# Patient Record
Sex: Female | Born: 1960 | Race: White | Hispanic: No | Marital: Married | Smoking: Never smoker
Health system: Southern US, Community
[De-identification: ages and names within clinical notes are randomized; demographics above are authoritative.]

## PROBLEM LIST (undated history)

## (undated) DIAGNOSIS — F32A Depression, unspecified: Secondary | ICD-10-CM

## (undated) HISTORY — PX: HYSTERECTOMY: SHX81

---

## 2016-12-22 ENCOUNTER — Other Ambulatory Visit: Payer: Self-pay

## 2016-12-22 DIAGNOSIS — M5412 Radiculopathy, cervical region: Secondary | ICD-10-CM

## 2016-12-25 ENCOUNTER — Ambulatory Visit
Admission: RE | Admit: 2016-12-25 | Discharge: 2016-12-25 | Disposition: A | Discharge status: Home / Self Care | Payer: Commercial Managed Care - PPO | Source: Ambulatory Visit

## 2016-12-25 ENCOUNTER — Ambulatory Visit: Payer: Commercial Managed Care - PPO

## 2016-12-25 DIAGNOSIS — M50123 Cervical disc disorder at C6-C7 level with radiculopathy: Secondary | ICD-10-CM | POA: Insufficient documentation

## 2016-12-25 DIAGNOSIS — M47892 Other spondylosis, cervical region: Secondary | ICD-10-CM | POA: Insufficient documentation

## 2016-12-25 DIAGNOSIS — M5412 Radiculopathy, cervical region: Secondary | ICD-10-CM

## 2017-08-30 ENCOUNTER — Other Ambulatory Visit: Payer: Self-pay | Admitting: "Endocrinology

## 2017-08-30 DIAGNOSIS — Z780 Asymptomatic menopausal state: Secondary | ICD-10-CM

## 2017-08-31 ENCOUNTER — Ambulatory Visit: Payer: Commercial Managed Care - PPO | Attending: "Endocrinology

## 2017-08-31 DIAGNOSIS — Z780 Asymptomatic menopausal state: Secondary | ICD-10-CM | POA: Insufficient documentation

## 2019-07-24 ENCOUNTER — Other Ambulatory Visit: Payer: Self-pay | Admitting: "Endocrinology

## 2019-07-24 DIAGNOSIS — M8580 Other specified disorders of bone density and structure, unspecified site: Secondary | ICD-10-CM

## 2019-08-24 ENCOUNTER — Ambulatory Visit: Payer: Commercial Managed Care - PPO

## 2019-09-01 ENCOUNTER — Ambulatory Visit
Admission: RE | Admit: 2019-09-01 | Discharge: 2019-09-01 | Disposition: A | Discharge status: Home / Self Care | Payer: Commercial Managed Care - PPO | Source: Ambulatory Visit | Attending: "Endocrinology | Admitting: "Endocrinology

## 2019-09-01 DIAGNOSIS — M8580 Other specified disorders of bone density and structure, unspecified site: Secondary | ICD-10-CM | POA: Insufficient documentation

## 2019-10-12 ENCOUNTER — Other Ambulatory Visit: Payer: Self-pay | Admitting: "Endocrinology

## 2019-10-21 ENCOUNTER — Other Ambulatory Visit: Payer: Self-pay

## 2019-10-21 DIAGNOSIS — K862 Cyst of pancreas: Secondary | ICD-10-CM

## 2019-10-23 ENCOUNTER — Ambulatory Visit: Payer: Commercial Managed Care - PPO

## 2019-10-27 ENCOUNTER — Other Ambulatory Visit: Payer: Self-pay

## 2020-11-22 ENCOUNTER — Other Ambulatory Visit: Payer: Self-pay | Admitting: "Endocrinology

## 2020-12-11 ENCOUNTER — Emergency Department
Admission: EM | Admit: 2020-12-11 | Discharge: 2020-12-11 | Disposition: A | Discharge status: Home / Self Care | Payer: Commercial Managed Care - PPO | Attending: Emergency Medicine | Admitting: Emergency Medicine

## 2020-12-11 DIAGNOSIS — S61012A Laceration without foreign body of left thumb without damage to nail, initial encounter: Secondary | ICD-10-CM | POA: Insufficient documentation

## 2020-12-11 DIAGNOSIS — Z23 Encounter for immunization: Secondary | ICD-10-CM | POA: Insufficient documentation

## 2020-12-11 DIAGNOSIS — W260XXA Contact with knife, initial encounter: Secondary | ICD-10-CM | POA: Insufficient documentation

## 2020-12-11 HISTORY — DX: Depression, unspecified: F32.A

## 2020-12-11 MED ORDER — TETANUS-DIPHTH-ACELL PERTUSSIS 5-2.5-18.5 LF-MCG/0.5 IM SUSP
0.5000 mL | Freq: Once | INTRAMUSCULAR | Status: AC
Start: 2020-12-11 — End: 2020-12-11
  Administered 2020-12-11: 09:00:00 0.5 mL via INTRAMUSCULAR
  Filled 2020-12-11: qty 0.5

## 2020-12-11 MED ORDER — LIDOCAINE HCL 1 % IJ SOLN
20.0000 mL | Freq: Once | INTRAMUSCULAR | Status: DC
Start: 2020-12-11 — End: 2020-12-11
  Filled 2020-12-11: qty 20

## 2020-12-11 NOTE — ED Notes (Signed)
Bed: EX28  Expected date:   Expected time:   Means of arrival:   Comments:

## 2020-12-11 NOTE — ED Triage Notes (Signed)
Krista Barnett is a 60 y.o. female who presents to ED with laceration to left thumb sustained on a new kitchen knife this morning. Tetanus status unknown.     BP 130/87    Pulse 72    Temp 97.9 F (36.6 C) (Oral)    Resp 16    Ht 5\' 2"  (1.575 m)    Wt 65.8 kg    SpO2 98%    BMI 26.52 kg/m

## 2020-12-11 NOTE — ED Provider Notes (Signed)
EMERGENCY DEPARTMENT HISTORY AND PHYSICAL EXAM     None        Date: 12/11/2020  Patient Name: Krista Barnett  Attending Physician: Donny Pique, MD  Advanced Practice Provider: Rexene Alberts, PA-C    History of Presenting Illness       History Provided By: Patient  Interpreter: None required     Chief Complaint:   Finger laceration      HPI: Krista Barnett is a 60 y.o. female with a PMH of DM and HLD presenting to the ED with a laceration of the left thumb that occurred JPTA. Patient reports mild aching sensation of the left thumb. No worsening factors. Took tylenol JPTA with mild relief. Mechanism of injury was patient states she was putting away a knife in a drawer when it slipped, accidentally cutting her thumb. Patient denies numbness and weakness. Patient's Tetanus status unknown.     There were no other injuries.    PCP: Karl Ito, MD  SPECIALISTS:    No current facility-administered medications for this encounter.    Current Outpatient Medications:     metFORMIN (GLUCOPHAGE) 500 MG tablet, , Disp: , Rfl:     atorvastatin (LIPITOR) 10 MG tablet, every 24 hours, Disp: , Rfl:     gabapentin (NEURONTIN) 100 MG capsule, gabapentin 100 mg capsule, Disp: , Rfl:     levothyroxine (SYNTHROID) 88 MCG tablet, 88 mcg daily, Disp: , Rfl:     Past History     Past Medical History:  Past Medical History:   Diagnosis Date    Depression     per-diabetes       Past Surgical History:  Past Surgical History:   Procedure Laterality Date    HYSTERECTOMY         Family History:  No family history on file.    Social History:  Social History     Tobacco Use    Smoking status: Never Smoker    Smokeless tobacco: Never Used   Substance Use Topics    Alcohol use: Yes    Drug use: Never       Allergies:  No Known Allergies    Review of Systems     Review of Systems   Skin:        Positive for laceration. Negative for color change.   Neurological: Negative for tingling, numbness.    Physical Exam     Vitals:    12/11/20  0757   BP: 130/87   Pulse: 72   Resp: 16   Temp: 97.9 F (36.6 C)   TempSrc: Oral   SpO2: 98%   Weight: 65.8 kg   Height: 5\' 2"  (1.575 m)     Pulse Oximetry Analysis - Normal SpO2: 98 % on RA    Physical Exam  Vitals and nursing note reviewed.   Constitutional:       General: She is not in acute distress.     Appearance: Normal appearance.   HENT:      Head: Normocephalic and atraumatic.      Nose: Nose normal.   Eyes:      General:         Right eye: No discharge.         Left eye: No discharge.      Conjunctiva/sclera: Conjunctivae normal.   Cardiovascular:      Rate and Rhythm: Normal rate.      Pulses: Normal pulses.  Comments: Left radial pulse 2+  Pulmonary:      Effort: Pulmonary effort is normal. No respiratory distress.   Musculoskeletal:      Cervical back: Normal range of motion and neck supple.      Comments: Full range of motion of left thumb.  Flexion and extension strength 5 out of 5.   Skin:     General: Skin is warm and dry.      Comments: 1 cm superficial laceration of fingertip pad of left thumb. No active bleeding. No FB. Tidy wound edges.   Neurological:      Mental Status: She is alert and oriented to person, place, and time. Mental status is at baseline.      Comments: Sensation intact distal left thumb.             Diagnostic Study Results     Labs -  Lab Results    None         Radiologic Studies -   Radiology Results (24 Hour)     ** No results found for the last 24 hours. **      .      Medical Decision Making     I reviewed the vital signs, available nursing notes, past medical history, past surgical history, family history and social history.    Prior Records: Nursing notes.     Procedures:   Lac Repair    Date/Time: 12/11/2020 5:10 PM  Performed by: Rexene Alberts, PA  Authorized by: Donny Pique, MD     Consent:     Consent obtained:  Verbal    Consent given by:  Patient    Risks discussed:  Infection, need for additional repair, pain and poor cosmetic result  Laceration  details:     Location:  Finger    Finger location:  L thumb    Length (cm):  1  Repair type:     Repair type:  Simple  Pre-procedure details:     Preparation:  Patient was prepped and draped in usual sterile fashion  Exploration:     Hemostasis achieved with:  Direct pressure    Wound exploration: entire depth of wound probed and visualized      Wound extent: no foreign bodies/material noted, no nerve damage noted, no tendon damage noted, no underlying fracture noted and no vascular damage noted      Contaminated: no    Treatment:     Area cleansed with:  Saline    Amount of cleaning:  Standard    Irrigation solution:  Sterile saline    Irrigation method:  Pressure wash    Visualized foreign bodies/material removed: no    Skin repair:     Repair method:  Tissue adhesive  Approximation:     Approximation:  Close  Post-procedure details:     Dressing:  Adhesive bandage    Patient tolerance of procedure:  Tolerated well, no immediate complications        ED Medications  Medications   tetanus-diphth-acell pertussis (BOOSTRIX) injection 0.5 mL (0.5 mLs Intramuscular Given 12/11/20 0919)       ED Course:       Provider Notes:   Laceration to the left thumb. NVI. Wound fully explored. X-Ray was not performed. Repaired with dermabond. Removal not required. Tetanus booster was indicated and administered in the ED. Antibiotics not indicated at this time.    Wound care instructions were given to patient including but not limited to:  return for redness, swelling, purulent discharge, continued or worsening numbness or tinging.  These instructions were understood and the patient promised to return for worsening condition.  Advised patient that she should not get the area wet or the Dermabond will dissolve more quickly.  Patient verbalized understanding.    Patient was discharged by me at 0907 AM     Departure Condition: Good, stable  Patient Reevaluation (after medication/intervention): Yes, improved  Mobility at Discharge:  Ambulatory  Patient Teaching: Discharge instructions reviewed and patient verbalizes understanding  Departure Mode: By self          Diagnosis     Clinical Impression:   1. Laceration of left thumb without foreign body without damage to nail, initial encounter    2. Need for tetanus booster        Treatment Plan:   ED Disposition     ED Disposition Condition Date/Time Comment    Discharge  Wed Dec 11, 2020  9:07 AM Krista Barnett discharge to home/self care.    Condition at disposition: Stable            _______________________________    CHART OWNERSHIP: I, Foye Damron A Shareen Capwell, PA-C, am the primary clinician of record.  _______________________________       Rexene Alberts, PA  12/11/20 1720       Donny Pique, MD  12/12/20 [phone]

## 2020-12-11 NOTE — ED Notes (Signed)
Bed: EX29  Expected date:   Expected time:   Means of arrival:   Comments:

## 2020-12-11 NOTE — Discharge Instructions (Signed)
Laceration, Tissue Glue     Your wound has been closed with tissue glue.     Tissue glue is a sterile, liquid skin glue that holds wound edges together. The film usually stays in place for 5 to 10 days. Afterwards, it naturally falls off of your skin.  · There are 2 common brands of tissue glue: DERMABOND® and INDERMIL®.     Some swelling, redness, and pain are common with all wounds. This normally goes away as the wound heals. If there is more swelling, redness, or pain or the wound feels warm to touch, talk to your doctor. If the wound edges open again, contact your doctor. If the wound edges separate, contact your doctor.     If a bandage is used:  · Keep the bandage dry.  · Change the dressing every day until the adhesive film falls off or if it gets wet unless the doctor tells you to do something different.  · Do not place tape right over the tissue glue because taking off the tape may also take off the film.     Do not let the wound be exposed very long to sunlight or tanning lamps while the film is in place.     Do not scratch, rub, or pick at the tissue glue. This may loosen the film before the wound heals.     DO NOT put liquid or ointment medicines or any other product to the wound while the tissue glue is in place. This may loosen the film before the wound heals.     Protect the wound from being re-injured until the skin has had enough time to heal.     Every so often you may wet the wound quickly in the shower or bath. Until the tissue glue falls off on its own, do not soak or scrub your wound, do not swim and avoid periods of heavy activity that cause sweating. After showering or bathing, gently blot your wound dry with a soft towel. If using a protective dressing, put on a fresh, dry bandage. Keep tape off the tissue glue.     Put a clean, dry bandage over the wound if necessary to protect the wound.     YOU SHOULD SEEK MEDICAL ATTENTION IMMEDIATELY, EITHER HERE OR AT THE NEAREST EMERGENCY DEPARTMENT,  IF ANY OF THE FOLLOWING OCCURS:  · The wound re-opens.  · Redness develops around the wound.  · The pain gets worse.  · Fever (temperature higher than 100.4ºF / 38ºC).  · Pus drains from the wound.  · Swelling around the wound.

## 2021-12-23 IMAGING — CT CT CALCIUM SCORING
1 series · 15 of 20 positions shown, 19 images · non-contrast
Comparison: There are no previous exams available for comparison.

________________________________________________________________________________________________ 
CT CALCIUM SCORING, 12/23/2021 [DATE]:
INDICATION: Evaluate coronary artery calcification.

[Series 2: cascoreseq 3.0 b35s 60% · axial · 0.39mm/px · z∈[-64,+58]mm · 15 of 91 slices shown, 19 images]
[im 5/91  vessel]
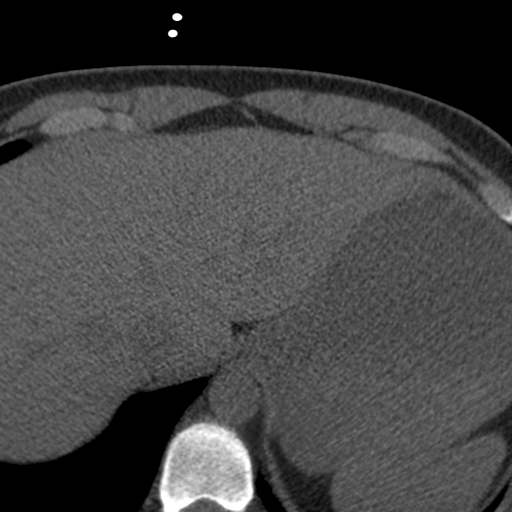
[im 5/91  lung]
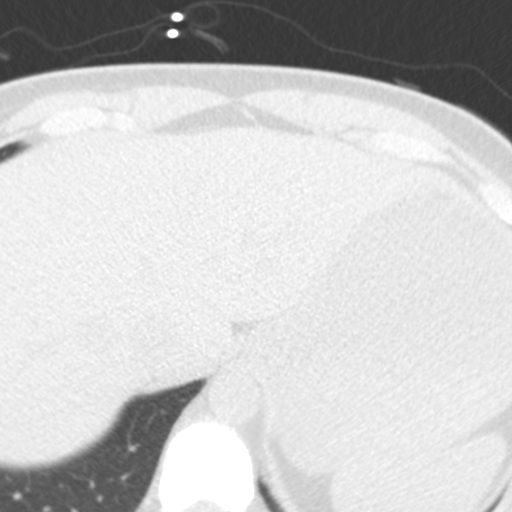
[im 10/91  vessel]
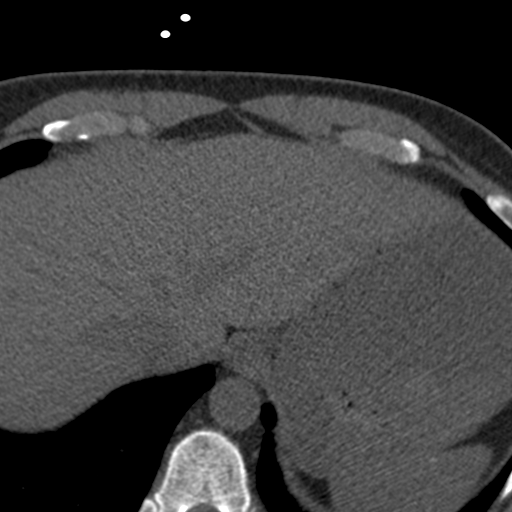
[im 19/91  vessel]
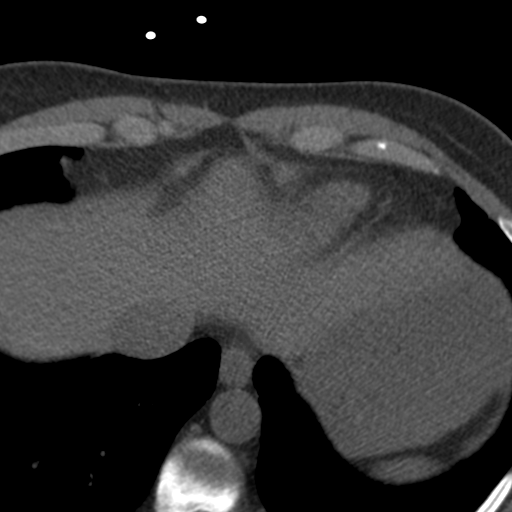
[im 24/91  vessel]
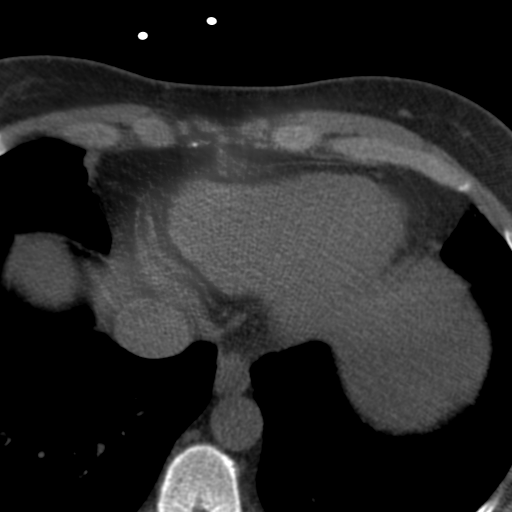
[im 29/91  vessel]
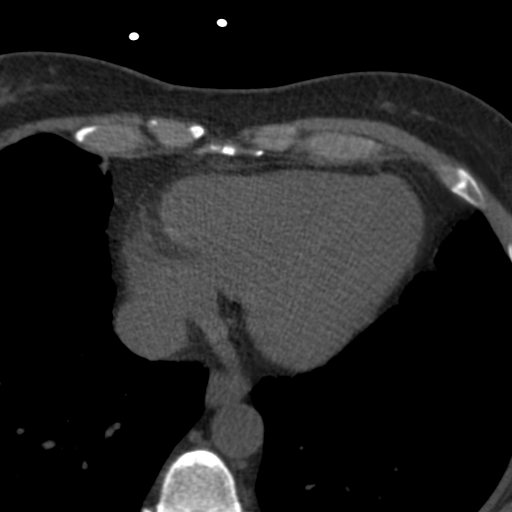
[im 29/91  lung]
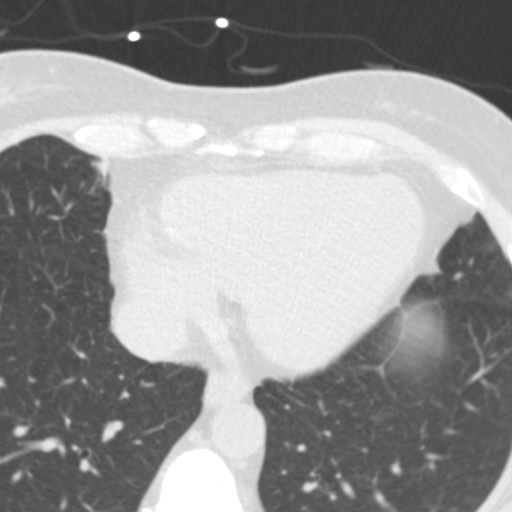
[im 34/91  vessel]
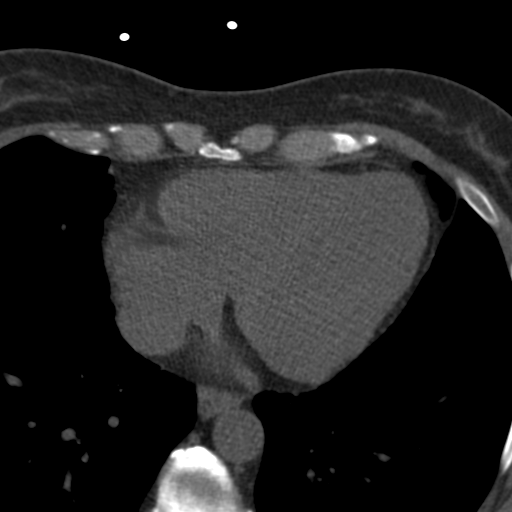
[im 38/91  vessel]
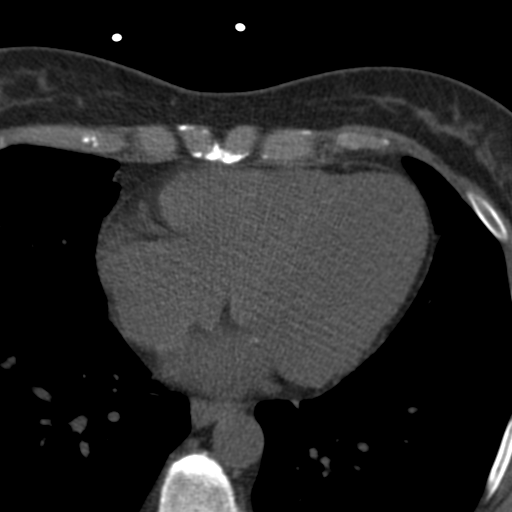
[im 48/91  vessel]
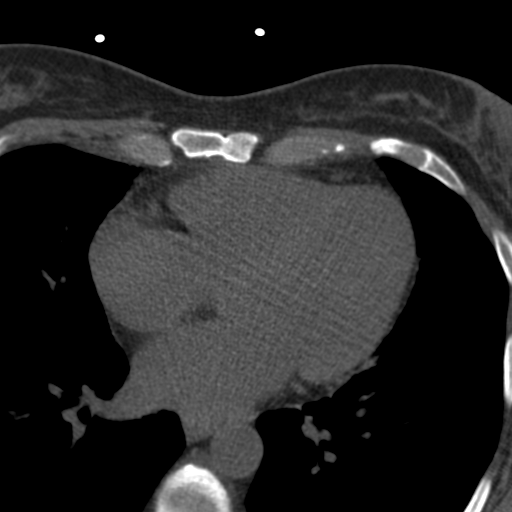
[im 53/91  vessel]
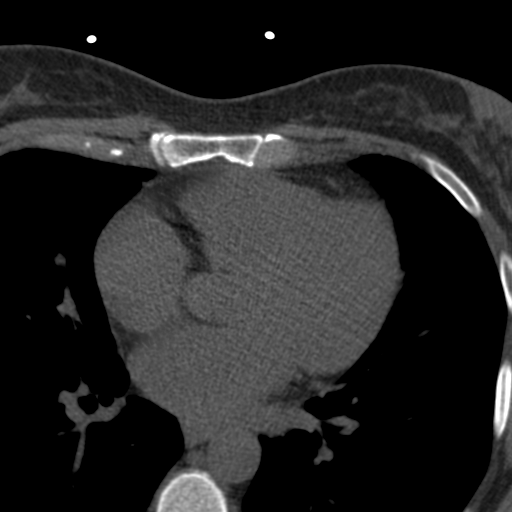
[im 53/91  lung]
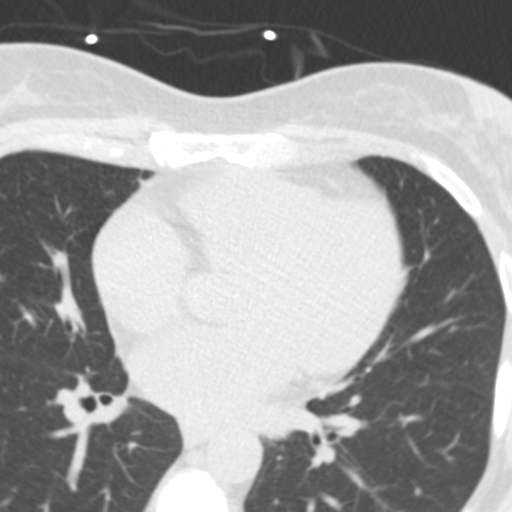
[im 57/91  vessel]
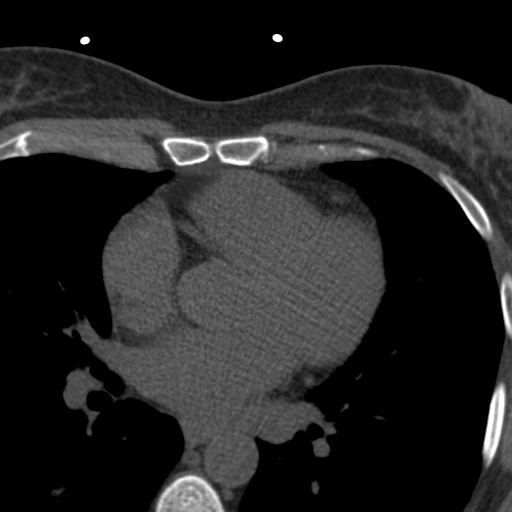
[im 62/91  vessel]
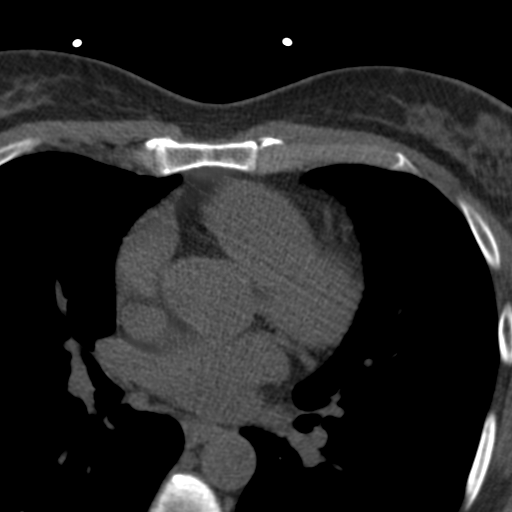
[im 67/91  vessel]
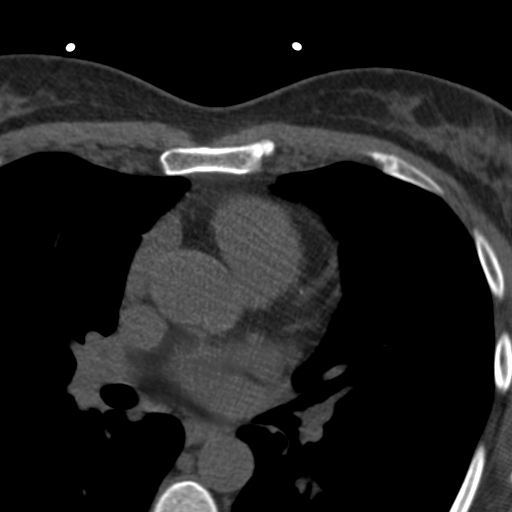
[im 76/91  vessel]
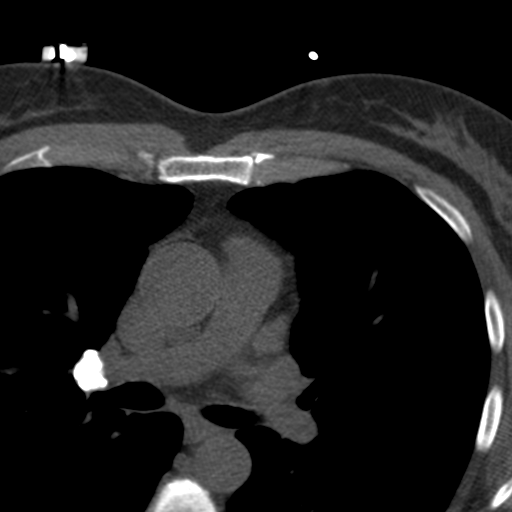
[im 76/91  lung]
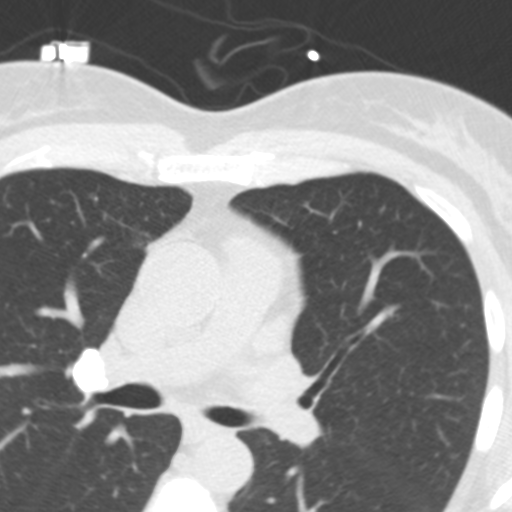
[im 81/91  vessel]
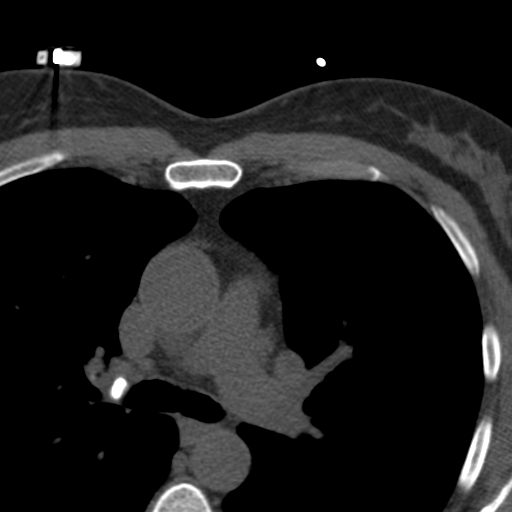
[im 86/91  vessel]
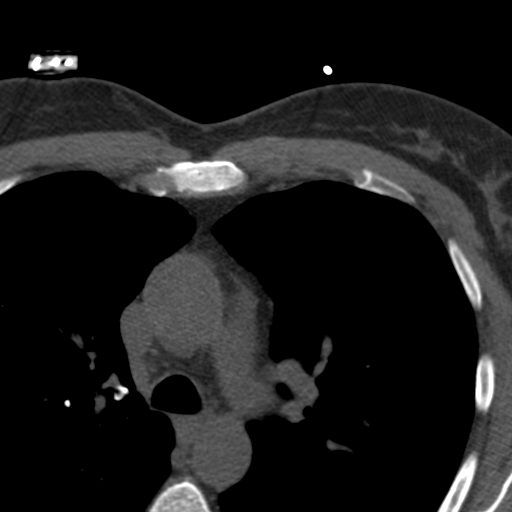

[15 of 20 positions shown; findings below may reference images not displayed]

FINDINGS: Visualized lungs are clear.
IMPRESSION: Total Calcium Score: 0 
No ancillary findings. 
Calcium score                                     Presence of Plaque 
0                                                    No evidence of plaque 
1-10                                               Minimal evidence of plaque 
11-100                                            Mild evidence of plaque 
101-400                                          Moderate evidence of plaque 
Over 400                                        Extensive evidence of plaque     

Calcium scoring sheets to follow. 
RADIATION DOSE REDUCTION: All CT scans are performed using radiation dose 
reduction techniques, when applicable.  Technical factors are evaluated and 
adjusted to ensure appropriate moderation of exposure.  Automated dose 
management technology is applied to adjust the radiation doses to minimize 
exposure while achieving diagnostic quality images.

## 2023-11-26 IMAGING — MR MRI LUMBAR SPINE WITHOUT CONTRAST
7 of 9 series · 15 of 48 positions shown · IV contrast (gadolinium)
Comparison: 09/28/2022 [facility] MRI

________________________________________________________________________________________________ 
MRI LUMBAR SPINE WITHOUT CONTRAST, 11/26/2023 [DATE]: 
CLINICAL INDICATION: Pain In Right Hip
TECHNIQUE: Multiplanar, multiecho position MR images of the lumbar spine were 
performed without intravenous gadolinium enhancement. Patient was scanned on a 
1.5T magnet

[Series 301: survey · axial · 10.0mm · 1.25mm/px · z∈[+138,+419]mm · 2 of 10 slices shown]
[im 1/10]
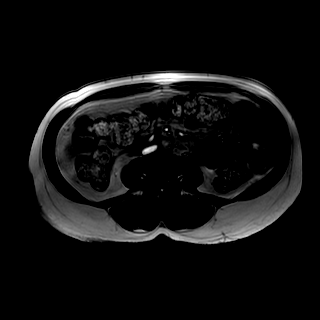
[im 10/10]
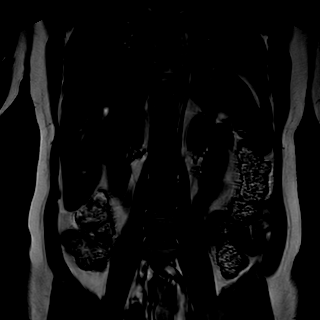

[Series 401: t2w_cor-surv · coronal · 6.0mm · 0.62mm/px · 1 of 10 slices shown]
[im 1/10]
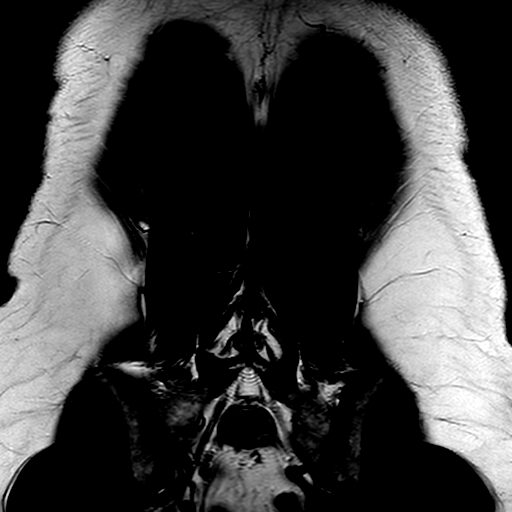

[Series 1001: t1_tse_sag · sagittal · 4.0mm · 0.34mm/px · 2 of 17 slices shown]
[im 1/17]
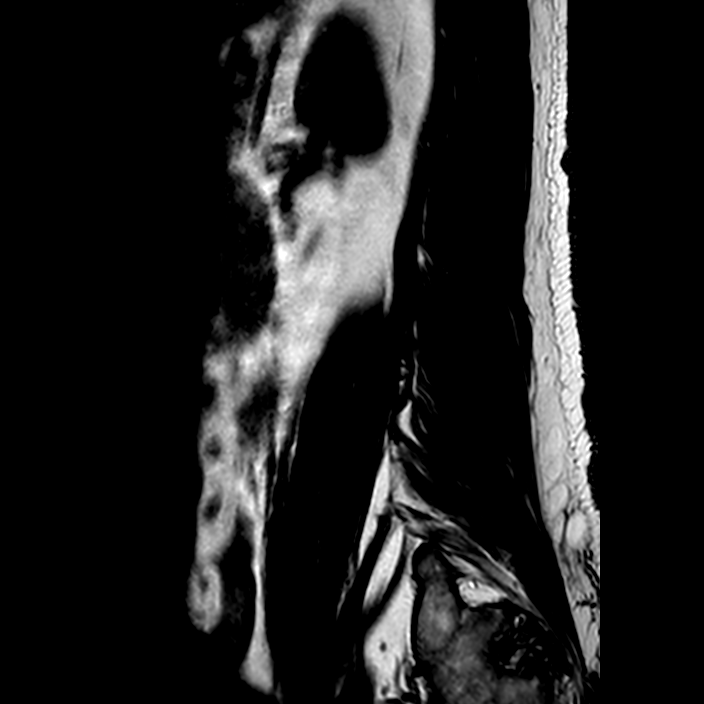
[im 17/17]
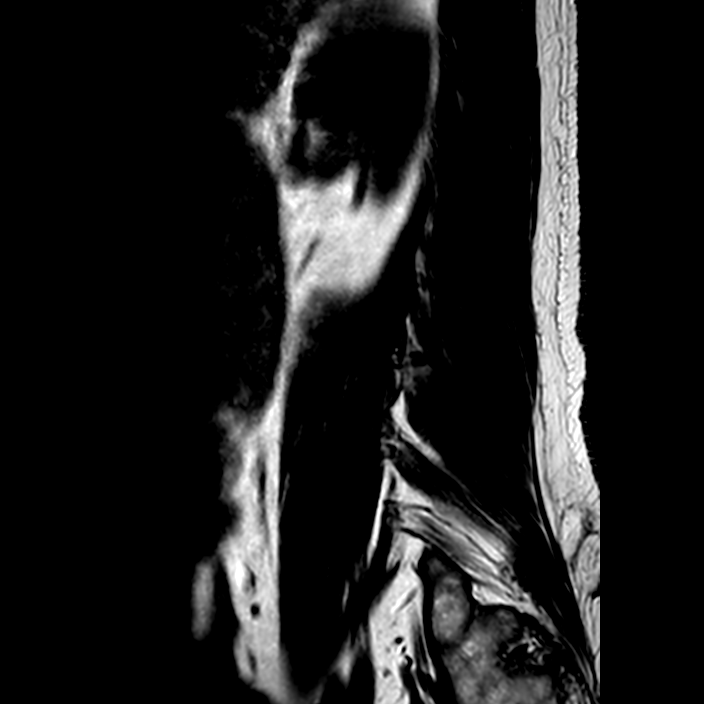

[Series 1102: (id)_mdixon_tse · sagittal · 4.0mm · 0.51mm/px · 2 of 17 slices shown]
[im 1/17]
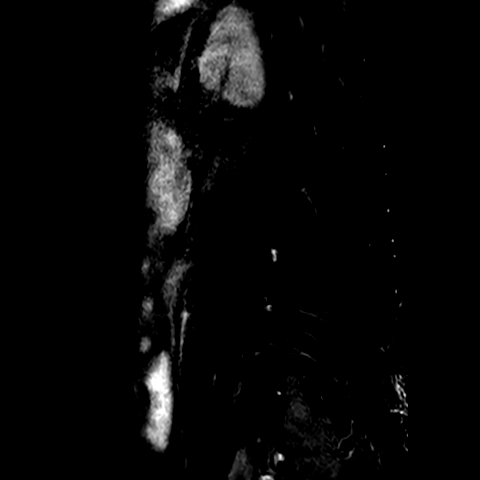
[im 17/17]
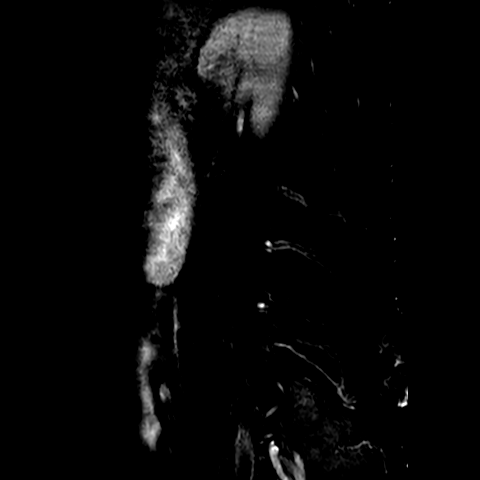

[Series 1103: st2w_mdixon_tse · sagittal · 4.0mm · 0.51mm/px · 2 of 17 slices shown]
[im 1/17]
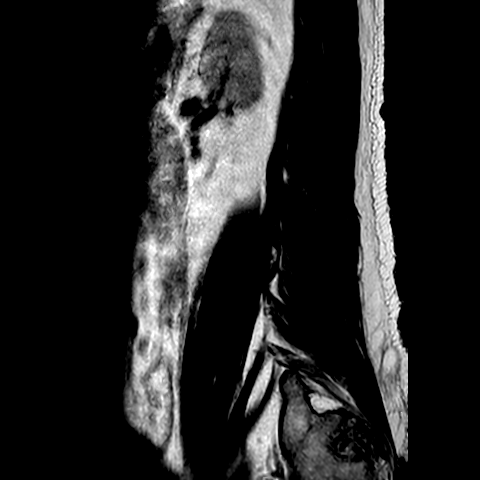
[im 17/17]
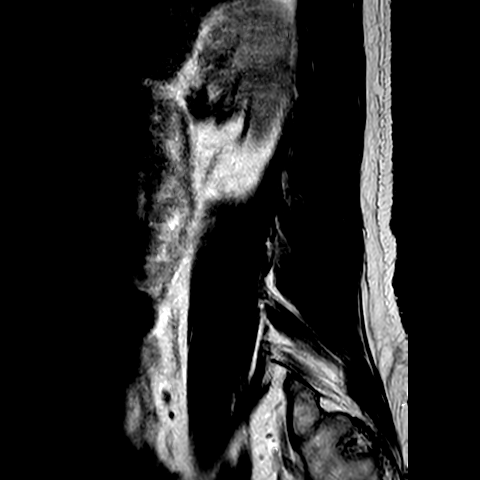

[Series 1202: (id) view_ax mpr · axial · 1.0mm · 0.25mm/px · 1 of 134 slices shown]
[im 9/134]
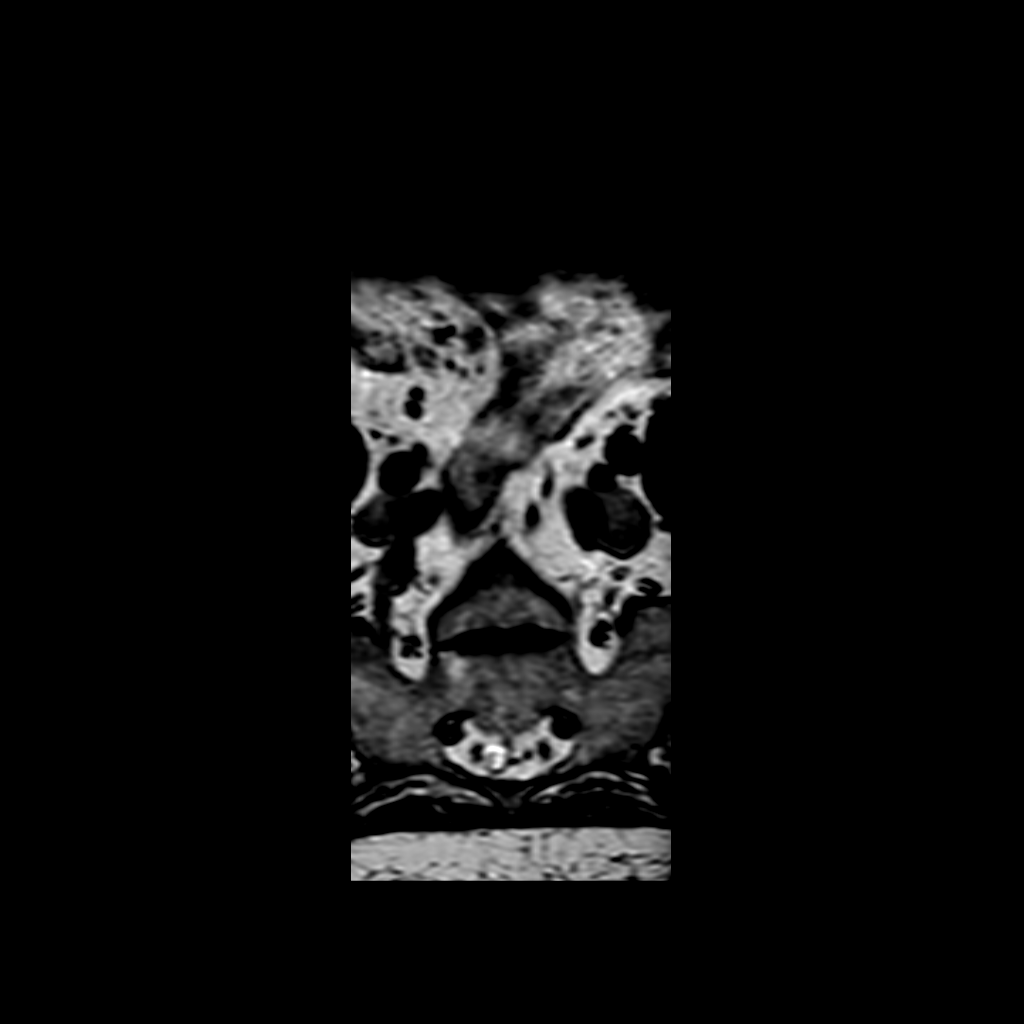

[Series 1401: T1 · axial · 4.0mm · 0.39mm/px · z∈[+53,+237]mm · 5 of 42 slices shown]
[im 1/42]
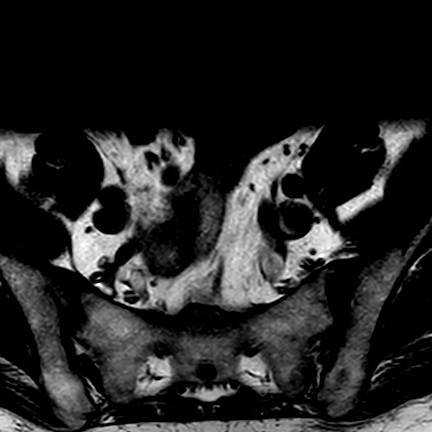
[im 11/42]
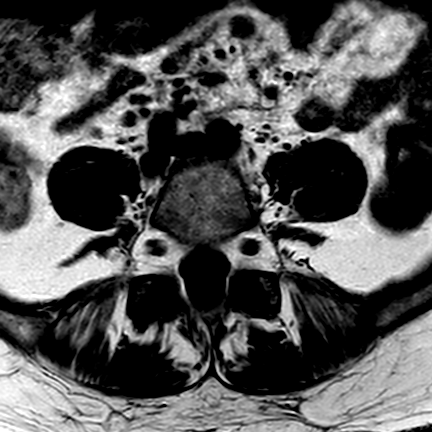
[im 21/42]
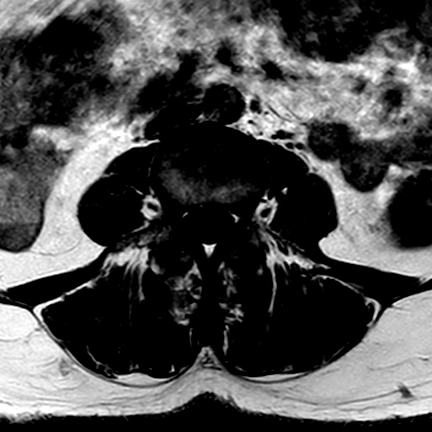
[im 31/42]
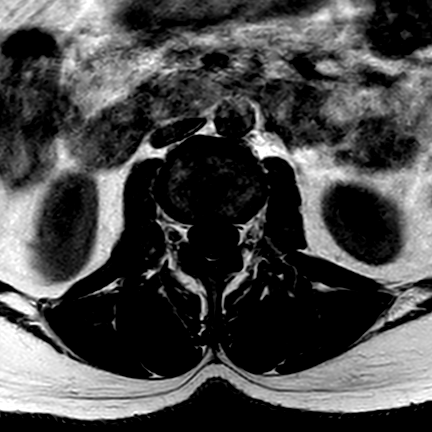
[im 42/42]
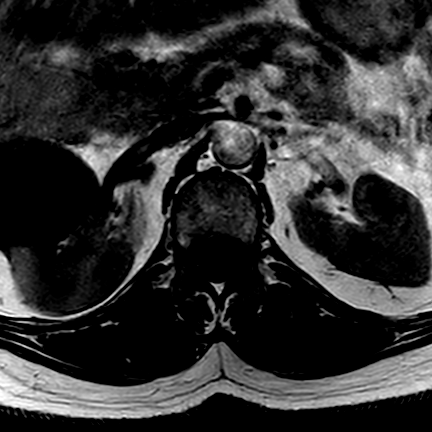

[15 of 48 positions shown; findings below may reference images not displayed]

FINDINGS: GENERAL: 
Nomenclature is based on 5 lumbar type vertebral bodies.     
ALIGNMENT: Stable 0.5 cm anterolisthesis at L5-S1. 
VERTEBRAE: No fracture. Multilevel osteophytes.  
MARROW SIGNAL: No focal suspect signal abnormality. 
CORD SIGNAL: Normal distal spinal cord and cauda equina. Conus medullaris 
terminates at L1-2. 
EXTRASPINAL STRUCTURES: 5.2 cm right mid renal cyst. Degenerative change of the 
SI joints. 
Modic I-II: None. 
Ligamentum Flavum > 2.5 mm: All levels 
SEGMENTAL: 
T12-L1: Normal disc height and signal. No herniation. Normal facets. No spinal 
canal or neural foraminal stenosis. 
L1-L2: Normal disc height and signal. No herniation. Normal facets. No spinal 
canal or neural foraminal stenosis. 
L2-L3: Normal disc height with disc desiccation. No herniation. Normal facets. 
No spinal canal or neural foraminal stenosis. 
L3-L4: Mild disc bulge, mild disc space narrowing and disc desiccation. No 
herniation. Mild facet arthropathy. No spinal canal or neural foraminal 
stenosis. 
L4-L5: Mild disc bulge, mild disc space narrowing and disc desiccation. No 
herniation. Mild facet arthropathy. No spinal canal or neural foraminal 
stenosis. 
L5-S1: 0.5 cm anterolisthesis, mild disc space narrowing and disc desiccation. 
No herniation. Marked right and moderate left facet arthropathy. No spinal canal 
or neural foraminal stenosis. 
IMPRESSION 
1.  L5-S1 grade 1 anterolisthesis and moderate/marked facet arthropathy. 
2.  Mild degenerative change of the remaining spine. No spinal stenosis or disc 
protrusions.

## 2023-11-26 IMAGING — MR MRI RIGHT HIP WITHOUT CONTRAST
4 of 6 series · 15 of 40 positions shown · IV contrast (gadolinium)
Comparison: 08/06/2023 [facility] MRI

________________________________________________________________________________________________ 
MRI RIGHT HIP WITHOUT CONTRAST, 11/26/2023 [DATE]: 
CLINICAL INDICATION: Low Back Pain. Pain In Right Hip
TECHNIQUE: Multiplanar, multiecho position MR images of the pelvis and right hip 
were performed without intravenous gadolinium enhancement. Small field-of-view 
imaging was performed of the hip.

[Series 201: survey · axial · 10.0mm · 1.17mm/px · z∈[-33,+187]mm · 2 of 10 slices shown]
[im 1/10]
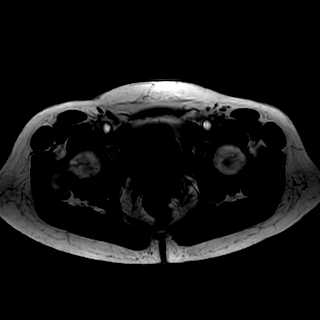
[im 10/10]
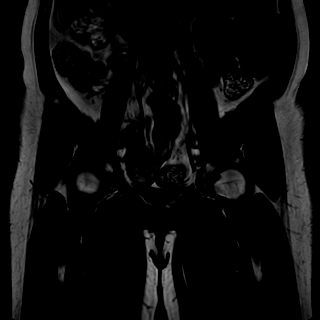

[Series 501: stir_cor-pelvis rl phase · coronal · 5.0mm · 0.65mm/px · 7 of 30 slices shown]
[im 1/30]
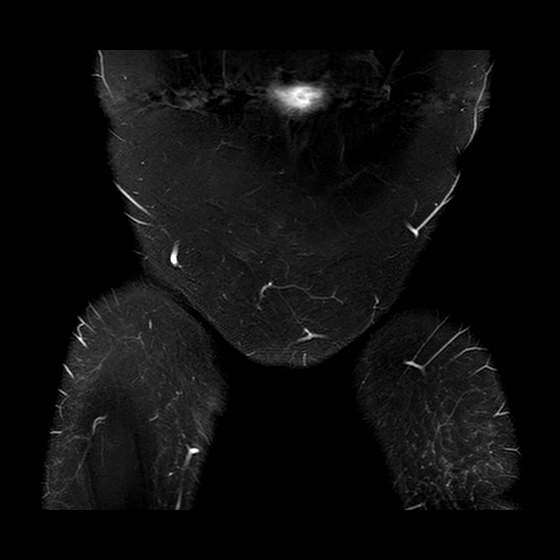
[im 5/30]
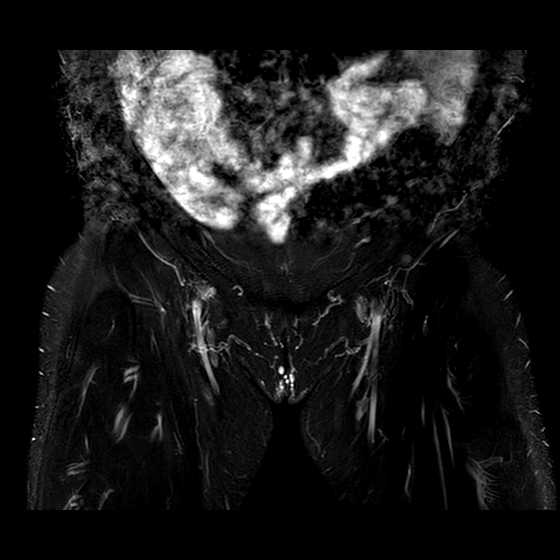
[im 9/30]
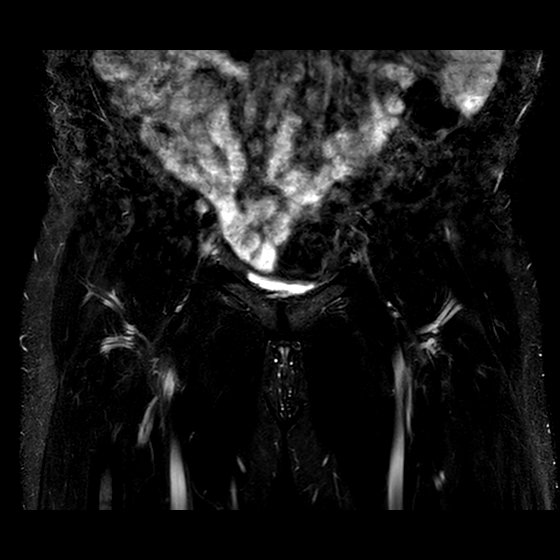
[im 13/30]
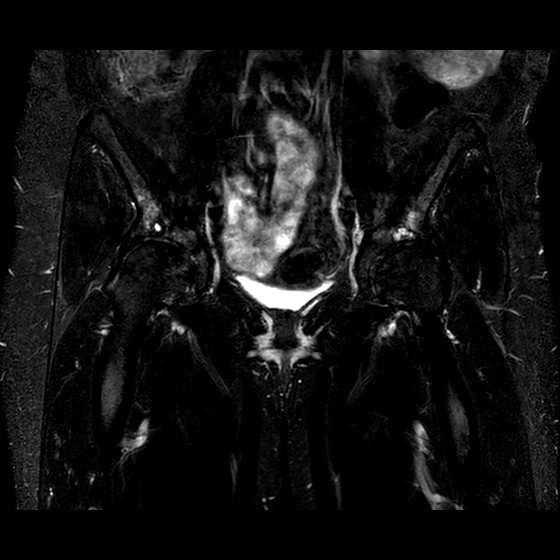
[im 17/30]
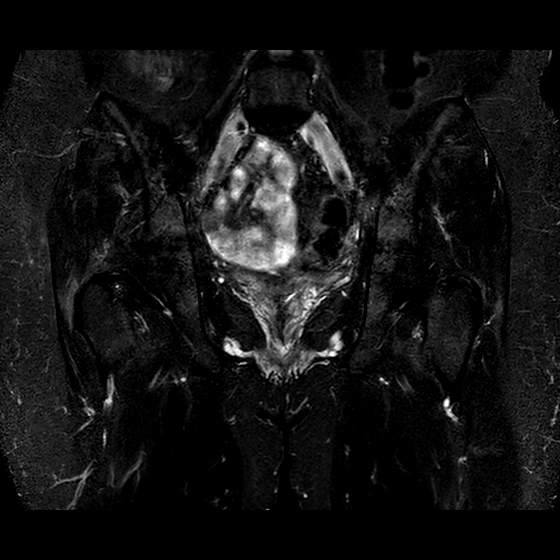
[im 21/30]
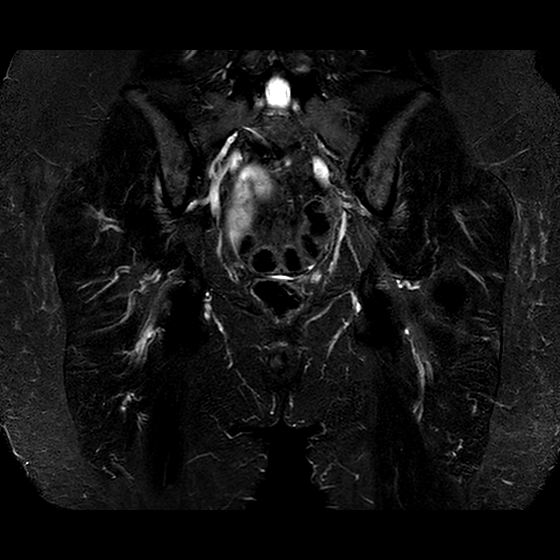
[im 25/30]
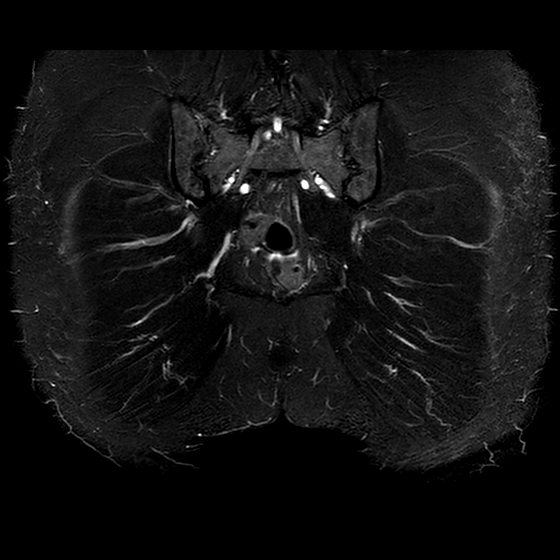

[Series 601: t1_(person_name) · axial · 5.0mm · 0.41mm/px · z∈[-108,+72]mm · 3 of 40 slices shown]
[im 5/40]
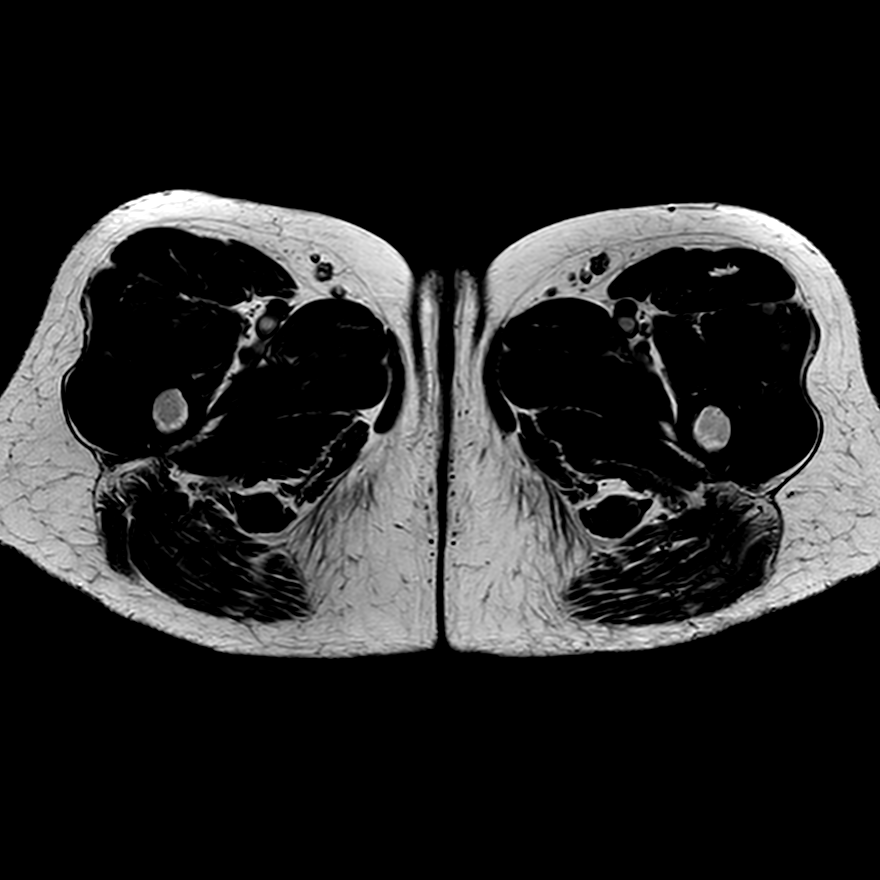
[im 22/40]
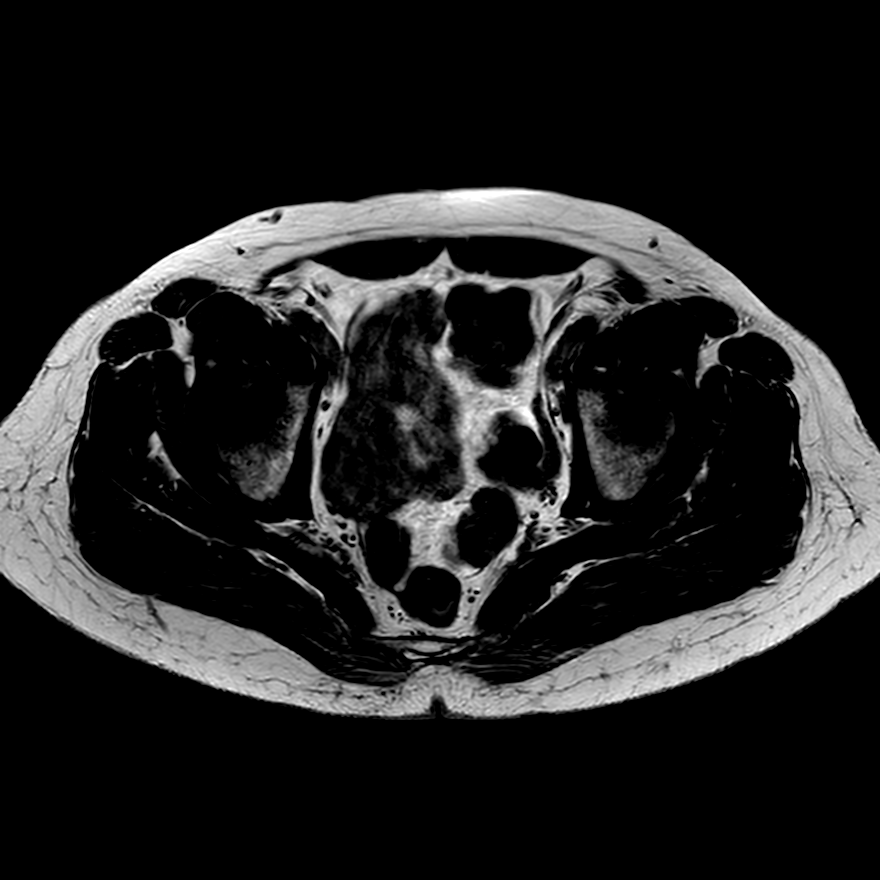
[im 35/40]
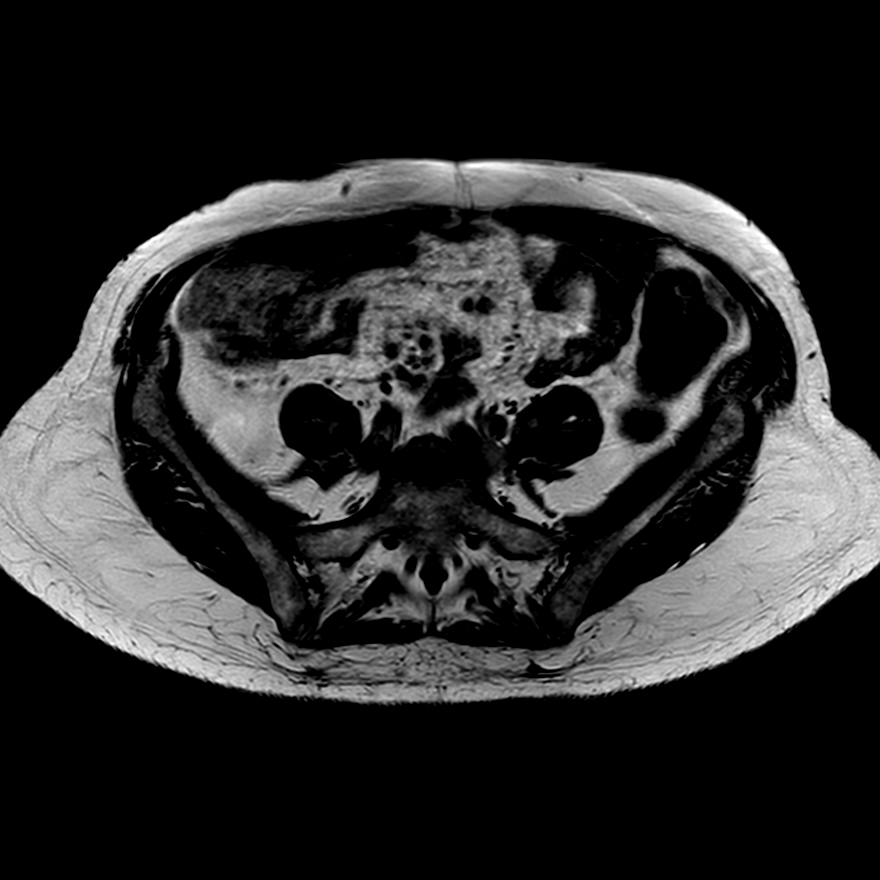

[Series 701: pd_fs_sag fh · sagittal · 4.0mm · 0.55mm/px · 3 of 32 slices shown]
[im 5/32]
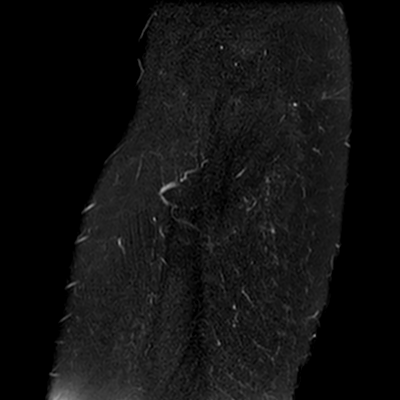
[im 18/32]
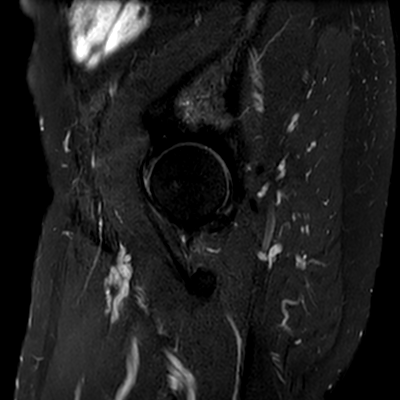
[im 27/32]
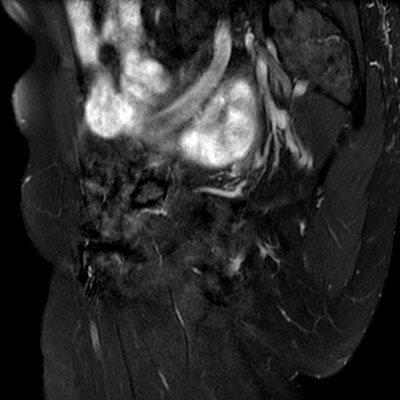

[15 of 40 positions shown; findings below may reference images not displayed]

FINDINGS: HIPS: Stable moderate degenerative change of the hips with partial-thickness and 
full-thickness chondromalacia, superior acetabular subcortical cysts and bone 
marrow edema. Degenerative superior and anterior labral tears. No paralabral 
cyst. No hip joint effusion. Resolution of the small right joint effusion and 
pericapsular soft tissue swelling on prior study. Both femoral heads maintain a 
spherical configuration without evidence of avascular necrosis or subarticular 
collapse. No abnormal morphology of the proximal femurs or acetabulum to 
predispose to impingement. 
PELVIC BONES: Normal marrow signal intensity. No fracture, contusion or marrow 
replacing lesion.  
SI JOINTS: Mild degenerative change. 
PUBIC SYMPHYSIS: Preserved. 
SPINE: Multilevel degenerative change of the spine. 
SOFT TISSUES: Mild tendinosis of the bilateral distal gluteus minimus tendons 
with tendon thickening, intermediate signal and mild peritendinous edema. The 
abductor cuffs are otherwise preserved without high-grade interstitial tear. 
There is trace fluid overlying the greater trochanters without overt 
trochanteric bursitis. The origins of the hamstrings are intact. The rectus 
abdominis-adductor aponeurotic complexes are intact. No mass, free fluid or 
adenopathy. Hysterectomy. The bowel and bladder are unremarkable.
IMPRESSION: 1.  Stable moderate degenerative change of the hips and degenerative labral 
tears.     
2.  Mild gluteal tendinosis and peritendinous edema. 
3.  Degenerative change of the SI joints and spine. 
4.  Hysterectomy.
